# Patient Record
Sex: Female | Born: 1945 | Race: White | Hispanic: No | Marital: Married | Smoking: Never smoker
Health system: Southern US, Community
[De-identification: ages and names within clinical notes are randomized; demographics above are authoritative.]

## PROBLEM LIST (undated history)

## (undated) DIAGNOSIS — M542 Cervicalgia: Secondary | ICD-10-CM

## (undated) DIAGNOSIS — R51 Headache: Secondary | ICD-10-CM

## (undated) DIAGNOSIS — I272 Pulmonary hypertension, unspecified: Secondary | ICD-10-CM

## (undated) DIAGNOSIS — R519 Headache, unspecified: Secondary | ICD-10-CM

## (undated) DIAGNOSIS — F329 Major depressive disorder, single episode, unspecified: Secondary | ICD-10-CM

## (undated) DIAGNOSIS — D649 Anemia, unspecified: Secondary | ICD-10-CM

## (undated) DIAGNOSIS — I10 Essential (primary) hypertension: Secondary | ICD-10-CM

## (undated) DIAGNOSIS — M1990 Unspecified osteoarthritis, unspecified site: Secondary | ICD-10-CM

## (undated) DIAGNOSIS — F32A Depression, unspecified: Secondary | ICD-10-CM

## (undated) HISTORY — PX: APPENDECTOMY: SHX54

## (undated) HISTORY — PX: AMPUTATION FINGER: SHX6594

## (undated) HISTORY — PX: SPINAL CORD STIMULATOR IMPLANT: SHX2422

## (undated) HISTORY — PX: OOPHORECTOMY: SHX86

## (undated) HISTORY — PX: CARPAL TUNNEL RELEASE: SHX101

---

## 2004-10-01 ENCOUNTER — Ambulatory Visit: Payer: Self-pay | Admitting: Internal Medicine

## 2005-11-09 ENCOUNTER — Ambulatory Visit: Payer: Self-pay | Admitting: Internal Medicine

## 2006-04-22 ENCOUNTER — Ambulatory Visit: Payer: Self-pay | Admitting: Unknown Physician Specialty

## 2006-08-03 ENCOUNTER — Ambulatory Visit: Payer: Self-pay | Admitting: Anesthesiology

## 2006-08-16 ENCOUNTER — Ambulatory Visit: Payer: Self-pay | Admitting: Anesthesiology

## 2006-09-15 ENCOUNTER — Ambulatory Visit: Payer: Self-pay | Admitting: Anesthesiology

## 2006-10-17 ENCOUNTER — Ambulatory Visit: Payer: Self-pay | Admitting: Anesthesiology

## 2006-10-21 ENCOUNTER — Ambulatory Visit: Payer: Self-pay | Admitting: Unknown Physician Specialty

## 2006-12-15 ENCOUNTER — Ambulatory Visit: Payer: Self-pay | Admitting: Anesthesiology

## 2007-01-03 ENCOUNTER — Ambulatory Visit: Payer: Self-pay | Admitting: Anesthesiology

## 2007-02-02 ENCOUNTER — Ambulatory Visit: Payer: Self-pay | Admitting: Internal Medicine

## 2007-02-15 ENCOUNTER — Ambulatory Visit: Payer: Self-pay | Admitting: Anesthesiology

## 2008-02-13 ENCOUNTER — Ambulatory Visit: Payer: Self-pay | Admitting: Internal Medicine

## 2009-04-16 ENCOUNTER — Ambulatory Visit: Payer: Self-pay | Admitting: Internal Medicine

## 2010-01-05 ENCOUNTER — Ambulatory Visit: Payer: Self-pay | Admitting: Unknown Physician Specialty

## 2010-05-08 ENCOUNTER — Ambulatory Visit: Payer: Self-pay | Admitting: Internal Medicine

## 2011-06-11 ENCOUNTER — Emergency Department: Payer: Self-pay | Admitting: Emergency Medicine

## 2011-06-17 ENCOUNTER — Ambulatory Visit: Payer: Self-pay | Admitting: Internal Medicine

## 2011-06-30 ENCOUNTER — Ambulatory Visit: Payer: Self-pay | Admitting: Internal Medicine

## 2011-08-11 ENCOUNTER — Ambulatory Visit: Payer: Self-pay | Admitting: Cardiology

## 2011-11-01 ENCOUNTER — Ambulatory Visit: Payer: Self-pay | Admitting: Specialist

## 2012-05-03 ENCOUNTER — Ambulatory Visit: Payer: Self-pay | Admitting: Internal Medicine

## 2012-10-12 ENCOUNTER — Ambulatory Visit: Payer: Self-pay | Admitting: Internal Medicine

## 2012-12-28 ENCOUNTER — Ambulatory Visit: Payer: Self-pay | Admitting: Internal Medicine

## 2013-02-26 ENCOUNTER — Ambulatory Visit: Payer: Self-pay | Admitting: Specialist

## 2013-05-04 ENCOUNTER — Ambulatory Visit: Payer: Self-pay | Admitting: Internal Medicine

## 2013-06-17 ENCOUNTER — Inpatient Hospital Stay: Payer: Self-pay | Admitting: Internal Medicine

## 2013-06-17 LAB — COMPREHENSIVE METABOLIC PANEL
Albumin: 3.6 g/dL (ref 3.4–5.0)
Anion Gap: 7 (ref 7–16)
BUN: 10 mg/dL (ref 7–18)
Bilirubin,Total: 0.6 mg/dL (ref 0.2–1.0)
Calcium, Total: 8.7 mg/dL (ref 8.5–10.1)
Chloride: 107 mmol/L (ref 98–107)
Co2: 26 mmol/L (ref 21–32)
Creatinine: 0.85 mg/dL (ref 0.60–1.30)
EGFR (Non-African Amer.): >60
Glucose: 113 mg/dL — ABNORMAL HIGH (ref 65–99)
Potassium: 3.8 mmol/L (ref 3.5–5.1)
SGOT(AST): 21 U/L (ref 15–37)
SGPT (ALT): 22 U/L (ref 12–78)
Sodium: 140 mmol/L (ref 136–145)
Total Protein: 6.5 g/dL (ref 6.4–8.2)

## 2013-06-17 LAB — URINALYSIS, COMPLETE
Bacteria: NONE SEEN
Bilirubin,UR: NEGATIVE
Glucose,UR: NEGATIVE mg/dL (ref 0–75)
Ketone: NEGATIVE
Nitrite: NEGATIVE
Ph: 6 (ref 4.5–8.0)
Protein: 25
Squamous Epithelial: 1

## 2013-06-17 LAB — PROTIME-INR
INR: 1
Prothrombin Time: 12.9 secs (ref 11.5–14.7)

## 2013-06-17 LAB — CBC
HCT: 41 % (ref 35.0–47.0)
MCHC: 32.9 g/dL (ref 32.0–36.0)
MCV: 86 fL (ref 80–100)
Platelet: 280 10*3/uL (ref 150–440)
RBC: 4.79 10*6/uL (ref 3.80–5.20)
RDW: 14 % (ref 11.5–14.5)
WBC: 8.2 10*3/uL (ref 3.6–11.0)

## 2013-06-17 LAB — LIPASE, BLOOD: Lipase: 82 U/L (ref 73–393)

## 2013-06-18 LAB — CBC WITH DIFFERENTIAL/PLATELET
Basophil %: 0.8 %
Eosinophil #: 0.1 10*3/uL (ref 0.0–0.7)
HCT: 37.8 % (ref 35.0–47.0)
Lymphocyte #: 1.6 10*3/uL (ref 1.0–3.6)
MCH: 29.2 pg (ref 26.0–34.0)
MCHC: 34.2 g/dL (ref 32.0–36.0)
Monocyte #: 0.8 x10 3/mm (ref 0.2–0.9)
Monocyte %: 10.5 %
Neutrophil #: 5.3 10*3/uL (ref 1.4–6.5)
Neutrophil %: 67 %
Platelet: 230 10*3/uL (ref 150–440)
RBC: 4.42 10*6/uL (ref 3.80–5.20)
WBC: 7.9 10*3/uL (ref 3.6–11.0)

## 2013-06-18 LAB — BASIC METABOLIC PANEL
BUN: 7 mg/dL (ref 7–18)
Calcium, Total: 8.4 mg/dL — ABNORMAL LOW (ref 8.5–10.1)
Co2: 26 mmol/L (ref 21–32)
EGFR (African American): >60
Potassium: 3.4 mmol/L — ABNORMAL LOW (ref 3.5–5.1)

## 2013-06-18 LAB — TSH: Thyroid Stimulating Horm: 2.93 u[IU]/mL

## 2013-06-18 LAB — MAGNESIUM: Magnesium: 1.5 mg/dL — ABNORMAL LOW

## 2013-06-18 LAB — CLOSTRIDIUM DIFFICILE BY PCR

## 2013-06-19 LAB — CBC WITH DIFFERENTIAL/PLATELET
Basophil #: 0.1 10*3/uL (ref 0.0–0.1)
Basophil %: 1.2 %
Eosinophil #: 0.1 10*3/uL (ref 0.0–0.7)
Eosinophil %: 1.8 %
HGB: 13.5 g/dL (ref 12.0–16.0)
MCH: 29.1 pg (ref 26.0–34.0)
MCV: 85 fL (ref 80–100)
Monocyte #: 0.4 x10 3/mm (ref 0.2–0.9)
Monocyte %: 6.1 %
RBC: 4.65 10*6/uL (ref 3.80–5.20)
WBC: 6.5 10*3/uL (ref 3.6–11.0)

## 2013-06-19 LAB — POTASSIUM: Potassium: 3.9 mmol/L (ref 3.5–5.1)

## 2013-06-20 LAB — CBC WITH DIFFERENTIAL/PLATELET
Basophil #: 0.1 10*3/uL (ref 0.0–0.1)
Eosinophil %: 1.8 %
HCT: 38.4 % (ref 35.0–47.0)
HGB: 13.3 g/dL (ref 12.0–16.0)
Lymphocyte #: 1.1 10*3/uL (ref 1.0–3.6)
MCH: 29.4 pg (ref 26.0–34.0)
MCHC: 34.8 g/dL (ref 32.0–36.0)
MCV: 85 fL (ref 80–100)
Monocyte #: 0.5 x10 3/mm (ref 0.2–0.9)
Monocyte %: 7.5 %
Neutrophil #: 4.3 10*3/uL (ref 1.4–6.5)
Neutrophil %: 71.7 %
Platelet: 256 10*3/uL (ref 150–440)
RBC: 4.54 10*6/uL (ref 3.80–5.20)

## 2013-06-20 LAB — BASIC METABOLIC PANEL
Chloride: 109 mmol/L — ABNORMAL HIGH (ref 98–107)
Creatinine: 0.77 mg/dL (ref 0.60–1.30)
EGFR (African American): >60
EGFR (Non-African Amer.): >60
Osmolality: 276 (ref 275–301)
Potassium: 4 mmol/L (ref 3.5–5.1)

## 2013-06-20 LAB — URINE CULTURE

## 2013-06-21 LAB — LIPID PANEL: Cholesterol: 172 mg/dL (ref 0–200)

## 2013-06-21 LAB — HEMATOCRIT: HCT: 39.8 % (ref 35.0–47.0)

## 2013-07-03 ENCOUNTER — Ambulatory Visit: Payer: Self-pay | Admitting: Internal Medicine

## 2013-07-04 LAB — CANCER ANTIGEN 19-9: CA 19-9: <1 U/mL (ref 0–35)

## 2013-07-04 LAB — CEA: CEA: 1.3 ng/mL (ref 0.0–4.7)

## 2013-07-04 LAB — CANCER ANTIGEN 27.29: CA 27.29: 24.5 U/mL (ref 0.0–38.6)

## 2013-07-04 LAB — CA 125: CA 125: 12.7 U/mL (ref 0.0–34.0)

## 2013-07-06 ENCOUNTER — Ambulatory Visit: Payer: Self-pay | Admitting: Internal Medicine

## 2013-07-26 LAB — PROT IMMUNOELECTROPHORES(ARMC)

## 2013-08-06 ENCOUNTER — Ambulatory Visit: Payer: Self-pay | Admitting: Internal Medicine

## 2013-08-20 ENCOUNTER — Ambulatory Visit: Payer: Self-pay | Admitting: Unknown Physician Specialty

## 2013-08-22 LAB — PATHOLOGY REPORT

## 2014-08-08 ENCOUNTER — Ambulatory Visit: Payer: Self-pay | Admitting: Internal Medicine

## 2015-03-28 NOTE — Consult Note (Signed)
Brief Consult Note: Diagnosis: ISCHEMIC COLITIS.   Comments: SEE DICTATED NOTE TO FOLLOW   NO ACUTE COMPLAINTS STOPPED BLEEDING ABDO NON TENDER LINGS CLEAR NODES NEG....IMP NO HX CLOT, NO FH CLOT, UP TO DATE WITH MAMMOGRAM. YIELD FOR INHERITED CLOTTING DISORDER IS LOW. AGREE WOULD CHECK FOR ACQUIRED AND GENETIC ABNORMALITIES IF  CT ANGIOGRAM DOES NOT SHOW A STRUCTURAL PROBLEM.  WOULD START WITH LUPUS AND CARDIOLIPIN ABS, PROT C AND S AND V LIDEN AND PROTHROMBIN MUTATION.  WOULD LATER CHECK TUMOR MARKERS. I WILL FOLLOW . IF DISCHARGE TOMORROW OK TO F/U FOR WORK UP IN CANCER CENTER AS OUT PATIENT.  Electronic Signatures: Marin RobertsGittin, Robert G (MD)  (Signed 16-Jul-14 21:21)  Authored: Brief Consult Note   Last Updated: 16-Jul-14 21:21 by Marin RobertsGittin, Robert G (MD)

## 2015-03-28 NOTE — H&P (Signed)
PATIENT NAME:  Susan Mann, Susan Mann MR#:  [id number] DATE OF BIRTH:  07-28-1946  DATE OF ADMISSION:  06/17/2013  PRIMARY CARE PHYSICIAN: Dr. Daniel Nones.   PRIMARY GASTROENTEROLOGIST: Dr. Mechele Collin.   HISTORY OF PRESENT ILLNESS: Susan Mann is a 69 year old, pleasant, white female with history of sympathetic reflex dystrophy, neuropathy, who had a colonoscopy in 2007 by Dr. Mechele Collin and was found to have external and internal hemorrhoids. The patient was doing well in her usual state of health under yesterday. Started to have 1 episode of vomiting which contained the food what she ate at [employer]. Followed by had urge to go to the toilet. Did not switch on the light. Did not see if there was any blood in the stools. However, overnight the patient had about 20 episodes of stools. They were small stools. In the morning noted to have bright red blood per rectum. Continued to have multiple episodes. The patient states had about 30 episodes of bloody bowel movements. Denies having any abdominal pain. Prior to having these episodes, had 1 good bowel movement. The patient denied any straining. Last colonoscopy did not mention about the patient having any diverticulosis, Workup in the Emergency Department, the patient's hemoglobin is 13.5 which is no change in the last 2 years. Started to experience mild dizziness.   PAST MEDICAL HISTORY:  1. Reflex sympathetic dystrophy.  2. Neuropathy.   ALLERGIES: TEGRETOL.   HOME MEDICATIONS:  1. Norco 5/325 mg 1 tablet every 6 hours as needed.  2. Gabapentin 300 mg 3 times a day.  3. Clonazepam 1 mg tablet at bedtime.  4. Citalopram 40 mg once a day.   SOCIAL HISTORY: Denies smoking, drinking alcohol or using illicit drugs. Lives by herself.   PAST SURGICAL HISTORY:  1. Hysterectomy.  2. Appendectomy.  3. Tonsillectomy.  4. Shoulder surgery.  5. Leg surgery.   FAMILY HISTORY: Noncontributory.    REVIEW OF SYSTEMS:  CONSTITUTIONAL: Generalized weakness.  EYES: No  change in vision.  ENT: No change in hearing. No sore throat.  ENDOCRINE: No polyuria or polydipsia.  GENITOURINARY: No dysuria or hematuria.  SKIN: No rash or lesions.  MUSCULOSKELETAL: Has multiple joint pains and aches.  NEUROLOGIC: No weakness or numbness in any part of the body.   PHYSICAL EXAMINATION:  GENERAL: This is a well-built, well-nourished, age-appropriate female lying down in the bed, not in distress.  VITAL SIGNS: Temperature 97.2, pulse 65, blood pressure 189/91, respiratory rate of 16, oxygen saturation is 97% on room air.  HEENT: Head normocephalic and atraumatic. There is no scleral icterus. Conjunctivae normal. Pupils equal and react to light. Mucous membranes moist. No pharyngeal erythema.  NECK: Supple. No lymphadenopathy. No JVD. No carotid bruit.  CHEST: Has no focal tenderness.  LUNGS: Bilaterally clear to auscultation.  HEART: S1 and S2 regular. No murmurs are heard. No pedal edema. Pulses 2+.  ABDOMEN: Bowel sounds present. Soft, nontender, nondistended. No hepatosplenomegaly.  SKIN: No rash or lesions.  MUSCULOSKELETAL: Good range of motion in all of the extremities.   LABS: CMP is completely within normal limits.   CBC:  8.2, hemoglobin 13.5, platelet count of 280. Coag profile is well within normal limits.   UA 1+ leukocyte esterase.   ASSESSMENT AND PLAN: Susan Mann is a 69 year old female who comes to the Emergency Department with bright red blood per rectum.  1. Bright red blood per rectum: Seems to be from the external and internal hemorrhoids which were identified during the previous colonoscopy in 2007. There was  no mention about diverticulosis. The patient's hemoglobin is stable. Will keep the patient on Anusol suppository. Consult Dr. Mechele CollinElliott in the morning. Continue with intravenous fluids. Continue to monitor H and H.  2. Hypertension, poorly controlled: The patient could be having underlying hypertension. Currently not on any medications. Will  continue to follow up during the hospital stay. The patient would benefit from home monitoring of the blood pressure.  3. Reflex sympathetic dystrophy: Continue home medications.  4. Keep the patient on deep vein thrombosis prophylaxis with sequential compression devices.    TIME SPENT: 45 minutes.   ____________________________ Susa GriffinsPadmaja Johnna Bollier, MD pv:gb D: 06/17/2013 23:19:25 ET T: 06/18/2013 00:06:04 ET JOB#: [id number]  cc: Susa GriffinsPadmaja Rigel Filsinger, MD, <Dictator> Lynnea FerrierBert J. Klein III, MD Susa GriffinsPADMAJA Kingsley Herandez MD ELECTRONICALLY SIGNED 07/04/2013 0:25

## 2015-03-28 NOTE — Consult Note (Signed)
CC abd pain, rectal bleeding.  Bleeding has stopped, no vomiting, VSS, abd minimal tenderness.  WBC 6.5, hgb 13.5.  I talked with Dr. [place] in radiology and could not construct a satisfactory angiogram from her CT due to amt of contrast in study.  I plan to do flex sig tomorrow with a Fleets enema.    Electronic Signatures: Scot JunElliott, Robert T (MD)  (Signed on 15-Jul-14 17:05)  Authored  Last Updated: 15-Jul-14 17:05 by Scot JunElliott, Robert T (MD)

## 2015-03-28 NOTE — Consult Note (Signed)
Brief Consult Note: Diagnosis: GI bleed, BRBPR, lower abdominal pain, Rectosigmoid colitis.   Patient was seen by consultant.   Consult note dictated.   Recommend to proceed with surgery or procedure.   Orders entered.   Discussed with Attending MD.   Comments: Patient seen and evaluated. Rectosigmoid colitis noted on CT scan with persistent lower abdominal pain and BRBPR. Likely ischemic colitis vs infectious colitis. She has been started on Flagyl, we will add Cipro 500 BID. Continue fluids, electrolyte replacement. If no response to above, she will likely benefit from a Colonoscopy to evaluate, which can be done by Dr Mechele CollinElliott on weds if clinically warrented. This was discussed with the pt. Full consult dictated.  Electronic Signatures: Ashok Cordiaarle, Emilia Kayes M (PA-C)  (Signed 14-Jul-14 14:45)  Authored: Brief Consult Note   Last Updated: 14-Jul-14 14:45 by Ashok CordiaEarle, Jermari Tamargo M (PA-C)

## 2015-03-28 NOTE — Discharge Summary (Signed)
PATIENT NAME:  Susan Mann, Susan Mann MR#:  [id number] DATE OF BIRTH:  1946/03/21  DATE OF ADMISSION:  06/17/2013 DATE OF DISCHARGE:  06/21/2013  FINAL DIAGNOSES: 1.  Ischemic colitis.  2.  Reflex sympathetic dystrophy.  3.  Peripheral neuropathy.   HISTORY AND PHYSICAL:  Please see dictated admission history and physical summary.   HOSPITAL COURSE:  The patient was admitted with onset of left lower quadrant abdominal pain with lower GI bleeding, multiple bloody stools.  Her blood counts were followed, which fortunately did not drop enough for the patient to require a transfusion.  She was placed on antibiotic therapy, and imaging was obtained, and appeared to be most consistent with ischemic colitis.  GI was consulted and the patient underwent flexible sigmoidoscopy, which revealed similar findings.  She was evaluated by hematology, with hypercoagulable work-up initiated.  Fortunately with the above treatment, she showed good improvement, diet was advanced, she was taken off IV fluids, and had no further bleeding.  She was therefore discharged to home in stable condition with her physical activity to be up as tolerated.  She should follow a 2 gram sodium diet, though should keep her diet bland for approximately one week.  We anticipate her following up with Dr. Lorre NickGittin in 1 to 2 weeks, in my office in approximately 2 weeks, and with Dr. Mechele CollinElliott in 2 to 4 weeks.   DISCHARGE MEDICATIONS: 1.  Citalopram 40 mg by mouth daily.  2.  Clonazepam 1 mg by mouth at bedtime.  3.  Norco 5/325 mg, 1 by mouth q. 6 hours as needed for severe pain.  4.  Gabapentin 300 mg, 3 capsules 3 times a day.  5.  Flagyl 500 mg by mouth 3 times daily x 3 days.  6.  Zofran 4 mg by mouth q. 8 hours as needed nausea or vomiting.  7.  Aspirin 81 mg by mouth daily.  8.  Amlodipine 5 mg by mouth daily for new diagnosed hypertension.     ____________________________ Lynnea FerrierBert J. Klein III, MD bjk:ea D: 06/28/2013 22:18:55 ET T: 06/28/2013  23:38:54 ET JOB#: [id number]  cc: Lynnea FerrierBert J. Klein III, MD, <Dictator> Daniel NonesBERT KLEIN MD ELECTRONICALLY SIGNED 07/03/2013 7:32

## 2015-03-28 NOTE — Consult Note (Signed)
Pt CC rectal bleeding and abd pain.  She has a story very suggestive of ischemic colitis with CT findings in the appropriate area.  She is feeling better and the bleeding has stopped.  Will ask radiology to do a CT angiogram with the CT that was already done and I plan to look with a light Wednesday for a flex sig.  Will recommend hematology work up for hypercoagulopathy.  Discussed with patient and her daughter who used to work with me at [facility] as an Charity fundraiserN.  Electronic Signatures: Scot JunElliott, Mekenzie Modeste T (MD)  (Signed on 14-Jul-14 19:18)  Authored  Last Updated: 14-Jul-14 19:18 by Scot JunElliott, Florance Paolillo T (MD)

## 2015-03-28 NOTE — Consult Note (Signed)
PATIENT NAME:  Susan Mann, Susan Mann MR#:  [id number] DATE OF BIRTH:  Dec 16, 1945  DATE OF CONSULTATION:  06/18/2013  REFERRING PHYSICIAN:  Susa GriffinsPadmaja Vasireddy, MD CONSULTING PHYSICIAN:  Hardie ShackletonKaryn M. Shanikka Wonders, PA-C  REASON FOR CONSULTATION:  Gastrointestinal bleed and abdominal pain.   HISTORY OF PRESENT ILLNESS: This is a pleasant 69 year old female who was admitted to the hospital yesterday for acute onset GI bleeding. She states that she ate out at a [employer], had 1 episode of vomiting, and since that time has had multiple episodes of bright red blood per rectum. She states that she will have 20 to 30 episodes in the past 24 hours where she gets up to use the restroom feeling as though she needs to have a bowel movement, however, just blood comes out. Her last well-formed bowel movement was at the initial onset of these symptoms. There are no other sick contacts and her husband did eat out at [employer] with her and ate the same thing, but he is feeling fine.  There is associated lower abdominal pain that is constant. A CT scan of the abdomen and pelvis was obtained showing bowel wall thickening at the rectosigmoid region concerning for colitis. She was started empirically on Flagyl therapy. Of note, her hemoglobin was 14.1 on admission and it has declined down to 12.9. Her vital signs have been stable. There is no longer any nausea or vomiting. There has been no melena. No fever or chills. No chest pain or shortness of breath. She has previously seen Dr. Mechele CollinElliott through [facility] and per the patient her last colonoscopy was in 2011 with a history of colon polyps. Also C. diff was negative.   PAST MEDICAL HISTORY: Neuropathy and sympathetic reflex dystrophy.   ALLERGIES: TEGRETOL.   HOME MEDICATIONS: Clonazepam, gabapentin, Norco, and citalopram.   PAST SURGICAL HISTORY: Hysterectomy, appendectomy, tonsillectomy, shoulder surgery and leg surgery.   FAMILY HISTORY: There is no known family history of GI  malignancy, colon polyps or IBD.   SOCIAL HISTORY: The patient denies any alcohol, tobacco or illicit drug use.   REVIEW OF SYSTEMS: A 10 system review of systems was obtained on the patient. She has noticed some increased weakness. All other pertinent positives are mentioned above and otherwise negative.   PHYSICAL EXAMINATION: VITAL SIGNS: Blood pressure 169/69, pulse 60, respirations 18, temp 97.6, bedside pulse ox 97%.  GENERAL: This is a pleasant 69 year old female resting quietly and comfortably in the exam room, in no acute distress. Alert and oriented x 3.  HEAD: Atraumatic, normocephalic.  NECK: Supple. No lymphadenopathy noted.  HENT: Sclerae anicteric. Mucous membranes moist.  LUNGS: Respirations are even and unlabored, clear to auscultation bilateral in anterior lung fields.  HEART: Regular rate and rhythm. S1, S2 noted.  ABDOMEN: Soft. Tender to palpation in the left lower quadrant and slightly in the right lower quadrant. No guarding or rebound. No signs of an acute abdomen. No organomegaly, masses or hernia is appreciated. Normoactive bowel sounds noted in all 4 quadrants.  RECTAL: Deferred.  PSYCHIATRIC: Appropriate mood and affect.   ASSESSMENT: 1.  Bright red blood per rectum.  2.  Lower abdominal pain.  3.  Abnormal CT scan noting rectosigmoid colitis.   PLAN: I have discussed this patient's case in detail with Dr. Lynnae Prudeobert Elliott who is involved in the development of the patient's plan of care. At this time, we do agree with the patient being maintained on empiric antibiotics including Flagyl, and we would like to add Cipro; however, there is  a contraindication with 1 of her other medications. Therefore, we will discuss with the pharmacist regarding other recommendations. Based on the CT findings and her history of persistent blood per rectum without accompanying stool, this may likely be an ischemic colitis. Therefore, we would like to proceed with a colonoscopy, likely to  be performed on Wednesday. We will see how she responds to the above antibiotics because certainly another differential possibility would be an infectious cause. The above plan was discussed in detail with the patient and the patient's family members who verbalize understanding and are in agreement. We will continue to monitor this patient closely and make further recommendations pending above and per clinical course.   Thank you so much for this consultation and for allowing [place] to participate in the patient's plan of care.  This services provided by Hardie Shackleton. Monia Timmers, PA-C under collaborative agreement with Dr. Lynnae Prude. ____________________________ Hardie Shackleton. Daniesha Driver, PA-C kme:sb D: 06/18/2013 15:17:58 ET T: 06/18/2013 15:39:54 ET JOB#: [id number]  cc: Hardie Shackleton. Caeleb Batalla, PA-C, <Dictator> Hardie Shackleton Shakenna Herrero PA ELECTRONICALLY SIGNED 06/20/2013 14:14

## 2015-03-28 NOTE — Consult Note (Signed)
CC: rectal bleeding. Pt without bleeding.  Flex sig shows changes suggestive of ischemic colitis with ulcerations and sharp demarcation of normal and inflammed.  Will get CT angiogram tomorrow and hematology consult today.  Electronic Signatures: Scot JunElliott, Robert T (MD)  (Signed on 16-Jul-14 13:53)  Authored  Last Updated: 16-Jul-14 13:53 by Scot JunElliott, Robert T (MD)

## 2015-06-24 ENCOUNTER — Other Ambulatory Visit: Payer: Self-pay | Admitting: Specialist

## 2015-06-24 DIAGNOSIS — M545 Low back pain: Secondary | ICD-10-CM

## 2015-06-26 ENCOUNTER — Ambulatory Visit
Admission: RE | Admit: 2015-06-26 | Discharge: 2015-06-26 | Disposition: A | Discharge status: Home / Self Care | Payer: Medicare Other | Source: Ambulatory Visit | Attending: Specialist | Admitting: Specialist

## 2015-06-26 DIAGNOSIS — M545 Low back pain: Secondary | ICD-10-CM

## 2015-07-10 ENCOUNTER — Telehealth (HOSPITAL_COMMUNITY): Payer: Self-pay | Admitting: Interventional Radiology

## 2015-07-10 ENCOUNTER — Other Ambulatory Visit (HOSPITAL_COMMUNITY): Payer: Self-pay | Admitting: Interventional Radiology

## 2015-07-10 DIAGNOSIS — M549 Dorsalgia, unspecified: Secondary | ICD-10-CM

## 2015-07-10 DIAGNOSIS — IMO0002 Reserved for concepts with insufficient information to code with codable children: Secondary | ICD-10-CM

## 2015-07-10 NOTE — Telephone Encounter (Signed)
Called pt's daughter, left VM for her to call to schedule appt for her mother JM °

## 2015-07-11 ENCOUNTER — Telehealth (HOSPITAL_COMMUNITY): Payer: Self-pay | Admitting: Interventional Radiology

## 2015-07-16 ENCOUNTER — Other Ambulatory Visit: Payer: Self-pay | Admitting: Radiology

## 2015-07-17 ENCOUNTER — Encounter (HOSPITAL_COMMUNITY): Payer: Self-pay

## 2015-07-17 ENCOUNTER — Other Ambulatory Visit (HOSPITAL_COMMUNITY): Payer: Self-pay | Admitting: Interventional Radiology

## 2015-07-17 ENCOUNTER — Ambulatory Visit (HOSPITAL_COMMUNITY)
Admission: RE | Admit: 2015-07-17 | Discharge: 2015-07-17 | Disposition: A | Discharge status: Home / Self Care | Payer: Medicare Other | Source: Ambulatory Visit | Attending: Interventional Radiology | Admitting: Interventional Radiology

## 2015-07-17 DIAGNOSIS — IMO0002 Reserved for concepts with insufficient information to code with codable children: Secondary | ICD-10-CM | POA: Insufficient documentation

## 2015-07-17 DIAGNOSIS — K559 Vascular disorder of intestine, unspecified: Secondary | ICD-10-CM | POA: Diagnosis not present

## 2015-07-17 DIAGNOSIS — M4856XA Collapsed vertebra, not elsewhere classified, lumbar region, initial encounter for fracture: Secondary | ICD-10-CM | POA: Insufficient documentation

## 2015-07-17 DIAGNOSIS — M8588 Other specified disorders of bone density and structure, other site: Secondary | ICD-10-CM | POA: Diagnosis not present

## 2015-07-17 DIAGNOSIS — G9050 Complex regional pain syndrome I, unspecified: Secondary | ICD-10-CM | POA: Diagnosis not present

## 2015-07-17 DIAGNOSIS — M549 Dorsalgia, unspecified: Secondary | ICD-10-CM

## 2015-07-17 DIAGNOSIS — G629 Polyneuropathy, unspecified: Secondary | ICD-10-CM | POA: Diagnosis not present

## 2015-07-17 LAB — CBC
HCT: 40.7 % (ref 36.0–46.0)
Hemoglobin: 13.7 g/dL (ref 12.0–15.0)
MCH: 28.7 pg (ref 26.0–34.0)
MCHC: 33.7 g/dL (ref 30.0–36.0)
MCV: 85.1 fL (ref 78.0–100.0)
PLATELETS: 272 10*3/uL (ref 150–400)
RBC: 4.78 MIL/uL (ref 3.87–5.11)
RDW: 14.1 % (ref 11.5–15.5)
WBC: 4.8 10*3/uL (ref 4.0–10.5)

## 2015-07-17 LAB — BASIC METABOLIC PANEL
Anion gap: 8 (ref 5–15)
BUN: 13 mg/dL (ref 6–20)
CALCIUM: 9.2 mg/dL (ref 8.9–10.3)
CO2: 26 mmol/L (ref 22–32)
Chloride: 106 mmol/L (ref 101–111)
Creatinine, Ser: 0.74 mg/dL (ref 0.44–1.00)
GFR calc Af Amer: >60 mL/min (ref >60)
GFR calc non Af Amer: >60 mL/min (ref >60)
GLUCOSE: 83 mg/dL (ref 65–99)
Potassium: 4 mmol/L (ref 3.5–5.1)
SODIUM: 140 mmol/L (ref 135–145)

## 2015-07-17 LAB — APTT: APTT: 28 s (ref 24–37)

## 2015-07-17 LAB — PROTIME-INR
INR: 0.95 (ref 0.00–1.49)
PROTHROMBIN TIME: 12.9 s (ref 11.6–15.2)

## 2015-07-17 MED ORDER — FENTANYL CITRATE (PF) 100 MCG/2ML IJ SOLN
INTRAMUSCULAR | Status: AC
  Filled 2015-07-17: qty 4

## 2015-07-17 MED ORDER — MIDAZOLAM HCL 2 MG/2ML IJ SOLN
INTRAMUSCULAR | Status: AC
  Filled 2015-07-17: qty 4

## 2015-07-17 MED ORDER — BUPIVACAINE HCL (PF) 0.25 % IJ SOLN
INTRAMUSCULAR | Status: AC
  Filled 2015-07-17: qty 30

## 2015-07-17 MED ORDER — HYDROMORPHONE HCL 1 MG/ML IJ SOLN
INTRAMUSCULAR | Status: AC | PRN
  Administered 2015-07-17: 1 mg via INTRAVENOUS

## 2015-07-17 MED ORDER — SODIUM CHLORIDE 0.9 % IV SOLN: INTRAVENOUS | Status: AC

## 2015-07-17 MED ORDER — HYDROMORPHONE HCL 1 MG/ML IJ SOLN
INTRAMUSCULAR | Status: DC
Start: 2015-07-17 — End: 2015-07-18
  Filled 2015-07-17: qty 1

## 2015-07-17 MED ORDER — CEFAZOLIN SODIUM-DEXTROSE 2-3 GM-% IV SOLR
2.0000 g | INTRAVENOUS | Status: AC
  Administered 2015-07-17: 2 g via INTRAVENOUS

## 2015-07-17 MED ORDER — TOBRAMYCIN SULFATE 1.2 G IJ SOLR
INTRAMUSCULAR | Status: AC
  Filled 2015-07-17: qty 1.2

## 2015-07-17 MED ORDER — CEFAZOLIN SODIUM-DEXTROSE 2-3 GM-% IV SOLR
INTRAVENOUS | Status: AC
  Filled 2015-07-17: qty 50

## 2015-07-17 MED ORDER — FENTANYL CITRATE (PF) 100 MCG/2ML IJ SOLN
INTRAMUSCULAR | Status: AC | PRN
  Administered 2015-07-17 (×4): 25 ug via INTRAVENOUS

## 2015-07-17 MED ORDER — MIDAZOLAM HCL 2 MG/2ML IJ SOLN
INTRAMUSCULAR | Status: AC | PRN
  Administered 2015-07-17 (×3): 1 mg via INTRAVENOUS

## 2015-07-17 MED ORDER — SODIUM CHLORIDE 0.9 % IV SOLN: Freq: Once | INTRAVENOUS | Status: DC

## 2015-07-17 NOTE — H&P (Signed)
Chief Complaint: Patient was seen in consultation today for compression fractures at the request of Dr. Jene Every  Referring Physician(s): Dr. Jene Every  History of Present Illness: Susan Mann is a 69 y.o. female with history of compression fractures approximately 2 years ago treated with a brace. She states end of may she developed lower back pain with no known injury and this pain is 10/10 without pain medication and with hydrocodone that she takes every 4-6 hrs 7/10. She denies any new loss of bladder or bowel function. She does admit to radiation of pain down the side and anterior portion of her right leg. She has had a CT scan on 06/26/15 and has been referred for possible VP/KP. She denies any chest pain, shortness of breath or palpitations. She denies any active signs of bleeding or excessive bruising. She denies any recent fever or chills. The patient denies any history of sleep apnea or chronic oxygen use. She has previously tolerated sedation without complications.    PMHx: Previous Compression fracture, ischemic colitis, reflex sympathetic dystrophy, peripheral neuropathy.   History reviewed. No pertinent past surgical history.  Allergies: Tegretol  Medications: Prior to Admission medications   Medication Sig Start Date End Date Taking? Authorizing Provider  acetaminophen (TYLENOL) 500 MG tablet Take 1,000 mg by mouth every 8 (eight) hours as needed for headache.   Yes Historical Provider, MD  amLODipine (NORVASC) 5 MG tablet Take 5 mg by mouth every morning.   Yes Historical Provider, MD  Calcium-Magnesium-Vitamin D (CALCIUM 1200+D3 PO) Take 2 tablets by mouth every morning.   Yes Historical Provider, MD  citalopram (CELEXA) 40 MG tablet Take 40 mg by mouth every morning.   Yes Historical Provider, MD  clonazePAM (KLONOPIN) 1 MG tablet Take 1 mg by mouth at bedtime as needed for anxiety.   Yes Historical Provider, MD  gabapentin (NEURONTIN) 300 MG capsule Take  900 mg by mouth 3 (three) times daily.   Yes Historical Provider, MD  HYDROcodone-acetaminophen (NORCO/VICODIN) 5-325 MG per tablet Take 1 tablet by mouth every 6 (six) hours as needed for moderate pain.   Yes Historical Provider, MD  naproxen sodium (ALEVE) 220 MG tablet Take 220 mg by mouth 2 (two) times daily with a meal.   Yes Historical Provider, MD  PSYLLIUM PO Take 5 mLs by mouth daily. Mix with water   Yes Historical Provider, MD     History reviewed. No pertinent family history.  Social History   Social History  . Marital Status: Married    Spouse Name: N/A  . Number of Children: N/A  . Years of Education: N/A   Social History Main Topics  . Smoking status: None  . Smokeless tobacco: None  . Alcohol Use: None  . Drug Use: None  . Sexual Activity: Not Asked   Other Topics Concern  . None   Social History Narrative  . None   Review of Systems: A 12 point ROS discussed and pertinent positives are indicated in the HPI above.  All other systems are negative.  Review of Systems  Vital Signs: BP 141/72 mmHg  Pulse 57  Temp(Src) 98 F (36.7 C)  Resp 20  Ht 5\' 3"  (1.6 m)  Wt 170 lb (77.111 kg)  BMI 30.12 kg/m2  SpO2 99%  Physical Exam  Constitutional: She is oriented to person, place, and time. No distress.  HENT:  Head: Normocephalic and atraumatic.  Cardiovascular: Normal rate and regular rhythm.  Exam reveals no  gallop and no friction rub.   No murmur heard. Pulmonary/Chest: Effort normal and breath sounds normal. No respiratory distress. She has no wheezes. She has no rales.  Abdominal: Soft. Bowel sounds are normal. She exhibits no distension. There is no tenderness.  Musculoskeletal:  L2-L4 region TTP, no skin changes  Neurological: She is alert and oriented to person, place, and time.  Skin: Skin is warm and dry. She is not diaphoretic.    Mallampati Score:  MD Evaluation Airway: WNL Heart: WNL Abdomen: WNL Chest/ Lungs: WNL ASA  Classification:  2 Mallampati/Airway Score: Two  Imaging: Ct Lumbar Spine Wo Contrast  06/26/2015   CLINICAL DATA:  Low back pain without radiation for 4 weeks. History of L2 fracture. Initial encounter.  EXAM: CT LUMBAR SPINE WITHOUT CONTRAST  TECHNIQUE: Multidetector CT imaging of the lumbar spine was performed without intravenous contrast administration. Multiplanar CT image reconstructions were also generated.  COMPARISON:  CT lumbar spine 02/26/2013.  FINDINGS: Remote, mild superior endplate compression fracture of L2 is identified and was seen on the prior examination. The patient has an inferior endplate compression fracture of L4 with vertebral body height loss up to approximately 50% centrally. Although fracture cannot be definitively characterized, fracture lines appear sharp suggesting acute or subacute injury. No extension into the posterior elements is identified. Vertebral body height is otherwise maintained. Alignment is unremarkable. Imaged intra-abdominal contents demonstrate a punctate nonobstructing stone in the lower pole left kidney. Scattered aortic atherosclerosis is noted.  T11-12: Mild ligamentum flavum thickening and facet degenerative change. The central canal and foramina appear widely patent.  T12-L1: There is some facet degenerative change. No disc bulge or protrusion. The central canal and foramina appear widely patent.  L1-2: There is a is very shallow disc bulge and endplate spur. The central canal and foramina appear widely patent.  L2-3: There is a shallow disc bulge causing mild central canal narrowing. The foramina appear open.  L3-4: There is some facet degenerative change and ligamentum flavum thickening. Minimal disc bulge is identified. There is only mild central canal narrowing. The foramina appear open.  L4-5: Advanced bilateral facet degenerative change. Shallow disc bulge is identified there is mild bony retropulsion off the inferior endplate of L4. The central canal appears mildly  narrowed. The foramina appear open.  L5-S1: Facet degenerative change is noted. The disc is partially calcified. There is a minimal bulge but the central canal and foramina appear open.  IMPRESSION: Inferior endplate compression fracture of L4 cannot be definitively characterized but appears acute or subacute.  Remote L2 superior endplate compression fracture is present on the comparison examination.  Overall mild spondylosis as described above is most notable at L3-4 and L4-5.  Punctate nonobstructing stone lower pole left kidney.  Atherosclerosis.   Electronically Signed   By: Drusilla Kanner M.D.   On: 06/26/2015 10:52    Labs:  CBC:  Recent Labs  07/17/15 1000  WBC 4.8  HGB 13.7  HCT 40.7  PLT 272    COAGS: No results for input(s): INR, APTT in the last 8760 hours.  BMP:  Recent Labs  07/17/15 1000  NA 140  K 4.0  CL 106  CO2 26  GLUCOSE 83  BUN 13  CALCIUM 9.2  CREATININE 0.74  GFRNONAA >60  GFRAA >60    Assessment and Plan: Lower back pain since 04/2015, no known injury- uncontrolled with pain medication History of compression fracture 2 years ago treated with brace CT 06/26/15 revealed L2 and L4 compression  fracture, Tenderness on exam Scheduled today for image guided L2 and L4 Vertebroplasty/Kyphoplasty with sedation  The patient has been NPO, no blood thinners taken, labs and vitals have been reviewed. Risks and Benefits discussed with the patient including, but not limited to education regarding the natural healing process of compression fractures without intervention, bleeding, infection, cement migration which may cause spinal cord damage, paralysis or pulmonary embolism. All of the patient's questions were answered, patient is agreeable to proceed. Consent signed and in chart.    Thank you for this interesting consult.  I greatly enjoyed meeting SHERYLANN VANGORDEN and look forward to participating in their care.  A copy of this report was sent to the requesting  provider on this date.  SignedBerneta Levins 07/17/2015, 10:56 AM   I spent a total of 30 Minutes in face to face in clinical consultation, greater than 50% of which was counseling/coordinating care for uncontrolled back pain with associated compression fractures.

## 2015-07-17 NOTE — Discharge Instructions (Signed)
1.No stooping,bending or lifting more than 10 lbs for 2 weeks. 2.Use Axtell to ambulate for 2 weeks. 3.RTC in 2 weeks  KYPHOPLASTY/VERTEBROPLASTY DISCHARGE INSTRUCTIONS  Medications: (check all that apply)     Resume all home medications as before procedure.       Resume your (aspirin/Plavix/Coumadin) on 07/18/15.                  Continue your pain medications as prescribed as needed.  Over the next 3-5 days, decrease your pain medication as tolerated.  Over the counter medications (i.e. Tylenol, ibuprofen, and aleve) may be substituted once severe/moderate pain symptoms have subsided.   Wound Care: - Bandages may be removed the day following your procedure.  You may get your incision wet once bandages are removed.  Bandaids may be used to cover the incisions until scab formation.  Topical ointments are optional.  - If you develop a fever greater than 101 degrees, have increased skin redness at the incision sites or pus-like oozing from incisions occurring within 1 week of the procedure, contact radiology at [phone] or [phone].  - Ice pack to back for 15-20 minutes 2-3 time per day for first 2-3 days post procedure.  The ice will expedite muscle healing and help with the pain from the incisions.   Activity: - Bedrest today with limited activity for 24 hours post procedure.  - No driving for 48 hours.  - Increase your activity as tolerated after bedrest (with assistance if necessary).  - Refrain from any strenuous activity or heavy lifting (greater than 10 lbs.).   Follow up: - Contact radiology at [phone] or [phone] if any questions/concerns.  - A physician assistant from radiology will contact you in approximately 1 week.  - If a biopsy was performed at the time of your procedure, your referring physician should receive the results in usually 2-3 days.

## 2015-07-17 NOTE — Procedures (Signed)
S/P L2  And L 4 balloon KPs

## 2015-07-29 ENCOUNTER — Other Ambulatory Visit (HOSPITAL_COMMUNITY): Payer: Self-pay | Admitting: Interventional Radiology

## 2015-07-29 DIAGNOSIS — IMO0002 Reserved for concepts with insufficient information to code with codable children: Secondary | ICD-10-CM

## 2015-08-13 ENCOUNTER — Ambulatory Visit (HOSPITAL_COMMUNITY): Admission: RE | Admit: 2015-08-13 | Payer: Medicare Other | Source: Ambulatory Visit

## 2015-08-25 ENCOUNTER — Ambulatory Visit (HOSPITAL_COMMUNITY)
Admission: RE | Admit: 2015-08-25 | Discharge: 2015-08-25 | Disposition: A | Discharge status: Home / Self Care | Payer: Medicare Other | Source: Ambulatory Visit | Attending: Interventional Radiology | Admitting: Interventional Radiology

## 2015-08-25 DIAGNOSIS — IMO0002 Reserved for concepts with insufficient information to code with codable children: Secondary | ICD-10-CM

## 2015-08-28 ENCOUNTER — Other Ambulatory Visit (HOSPITAL_COMMUNITY): Payer: Self-pay | Admitting: Interventional Radiology

## 2015-08-28 DIAGNOSIS — M549 Dorsalgia, unspecified: Secondary | ICD-10-CM

## 2015-08-28 DIAGNOSIS — IMO0002 Reserved for concepts with insufficient information to code with codable children: Secondary | ICD-10-CM

## 2015-09-01 ENCOUNTER — Ambulatory Visit
Admission: RE | Admit: 2015-09-01 | Discharge: 2015-09-01 | Disposition: A | Discharge status: Home / Self Care | Payer: Medicare Other | Source: Ambulatory Visit | Attending: Interventional Radiology | Admitting: Interventional Radiology

## 2015-09-01 DIAGNOSIS — M4856XD Collapsed vertebra, not elsewhere classified, lumbar region, subsequent encounter for fracture with routine healing: Secondary | ICD-10-CM | POA: Diagnosis not present

## 2015-09-01 DIAGNOSIS — IMO0002 Reserved for concepts with insufficient information to code with codable children: Secondary | ICD-10-CM

## 2015-09-01 DIAGNOSIS — M549 Dorsalgia, unspecified: Secondary | ICD-10-CM | POA: Insufficient documentation

## 2015-09-01 DIAGNOSIS — T148 Other injury of unspecified body region: Secondary | ICD-10-CM | POA: Diagnosis present

## 2015-09-01 DIAGNOSIS — X58XXXS Exposure to other specified factors, sequela: Secondary | ICD-10-CM | POA: Insufficient documentation

## 2015-09-01 MED ORDER — TECHNETIUM TC 99M MEDRONATE IV KIT
25.0000 | PACK | Freq: Once | INTRAVENOUS | Status: AC | PRN
  Administered 2015-09-01: 23.36 via INTRAVENOUS

## 2015-09-02 ENCOUNTER — Other Ambulatory Visit: Payer: Self-pay | Admitting: Internal Medicine

## 2015-09-02 DIAGNOSIS — Z1231 Encounter for screening mammogram for malignant neoplasm of breast: Secondary | ICD-10-CM

## 2015-09-09 ENCOUNTER — Other Ambulatory Visit: Payer: Self-pay | Admitting: Internal Medicine

## 2015-09-09 ENCOUNTER — Ambulatory Visit
Admission: RE | Admit: 2015-09-09 | Discharge: 2015-09-09 | Disposition: A | Discharge status: Home / Self Care | Payer: Medicare Other | Source: Ambulatory Visit | Attending: Internal Medicine | Admitting: Internal Medicine

## 2015-09-09 DIAGNOSIS — Z1231 Encounter for screening mammogram for malignant neoplasm of breast: Secondary | ICD-10-CM | POA: Insufficient documentation

## 2016-08-23 ENCOUNTER — Other Ambulatory Visit: Payer: Self-pay | Admitting: Internal Medicine

## 2016-08-23 DIAGNOSIS — Z1231 Encounter for screening mammogram for malignant neoplasm of breast: Secondary | ICD-10-CM

## 2016-08-24 IMAGING — XA IR KYPHO VERTEBRAL ADDL AGMENTATION
1 series · 13 of 14 positions shown · non-contrast
Comparison: none

CLINICAL DATA: Severe low back pain secondary to compression
fractures at L2 and L4.

[Series 300: ir kypho vertebral lumbar augmentation · 13 of 14 slices shown]
[im 1/14]
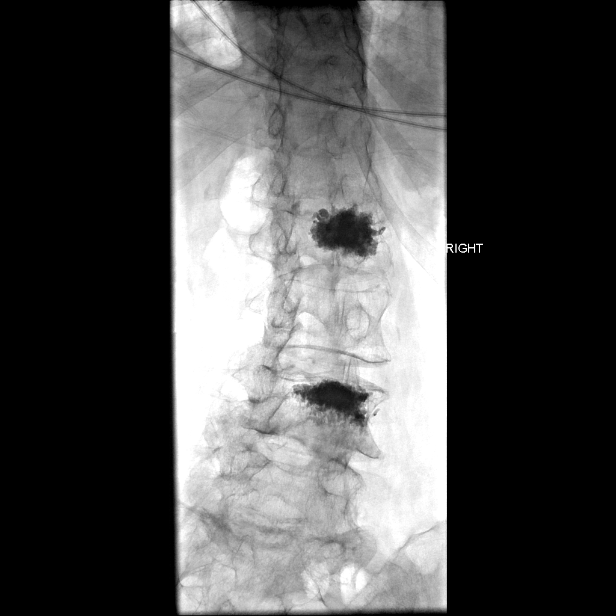
[im 2/14]
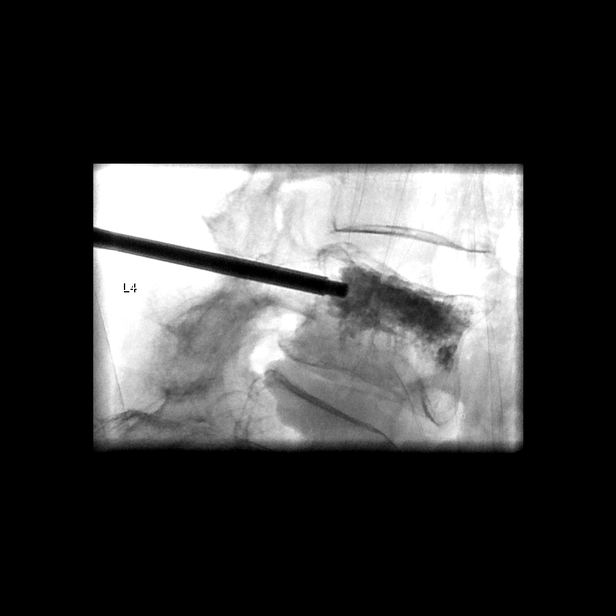
[im 3/14]
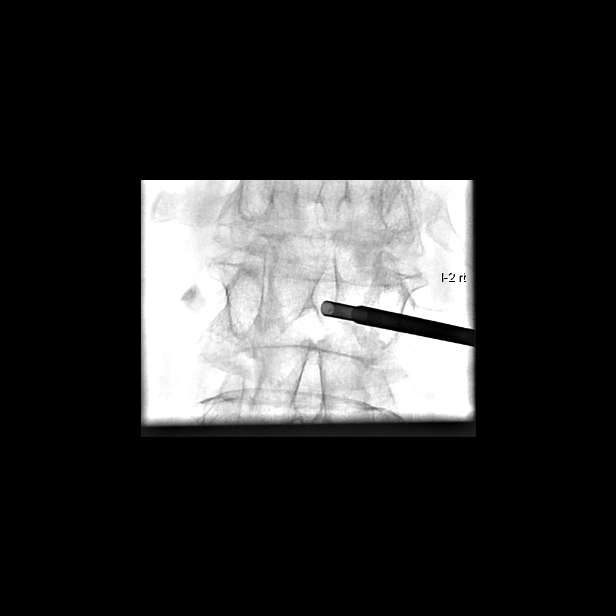
[im 4/14]
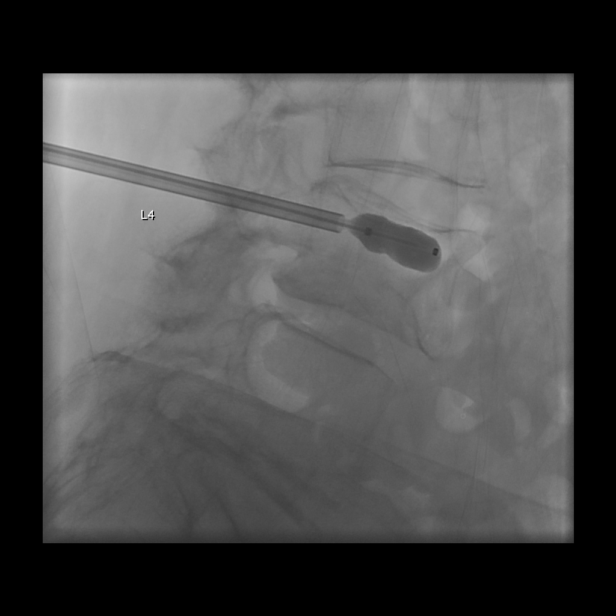
[im 5/14]
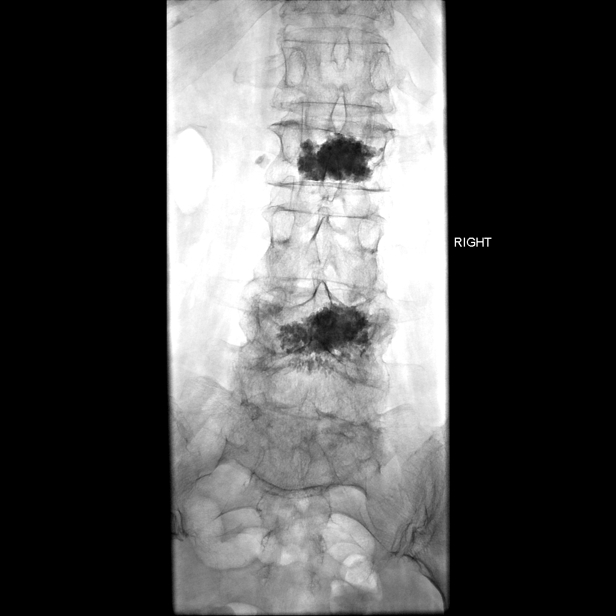
[im 6/14]
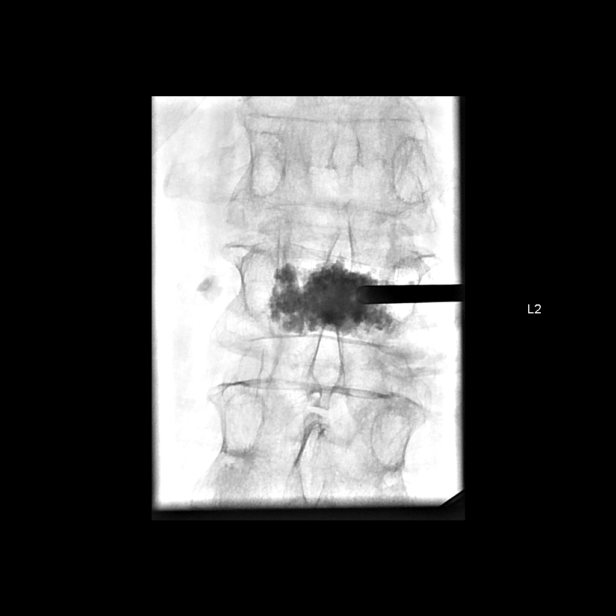
[im 8/14]
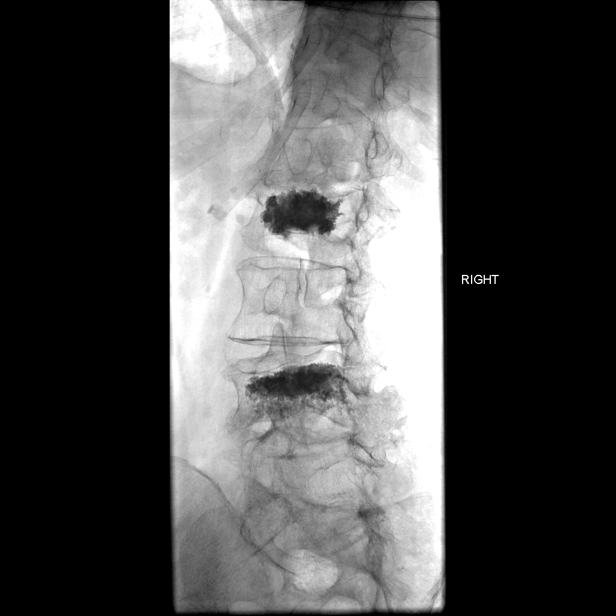
[im 9/14]
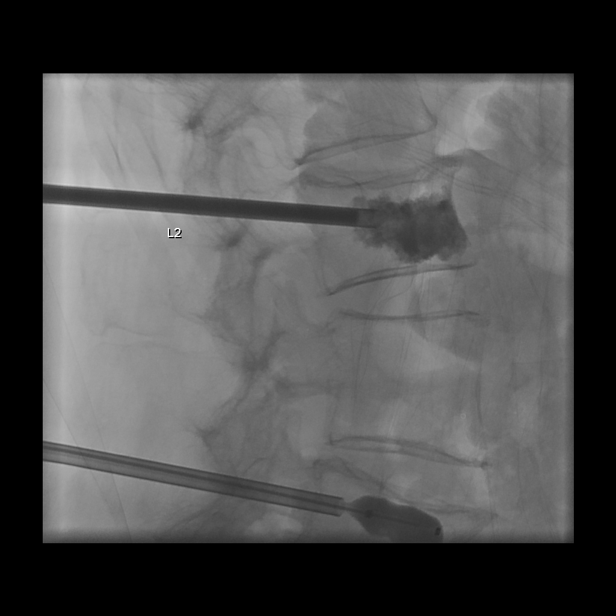
[im 10/14]
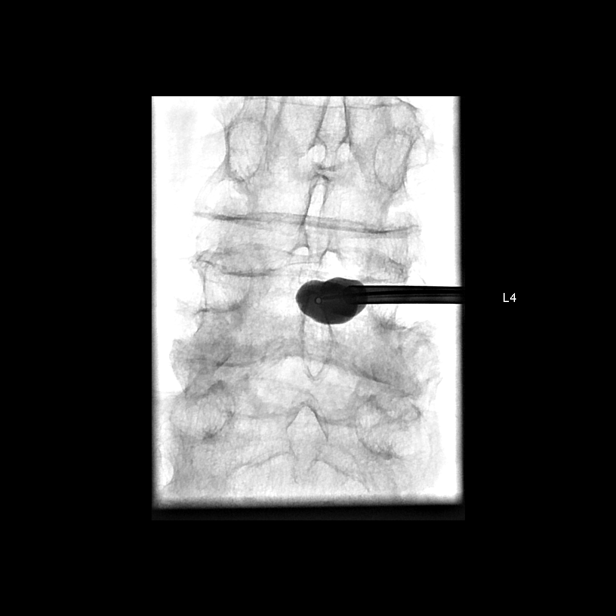
[im 11/14]
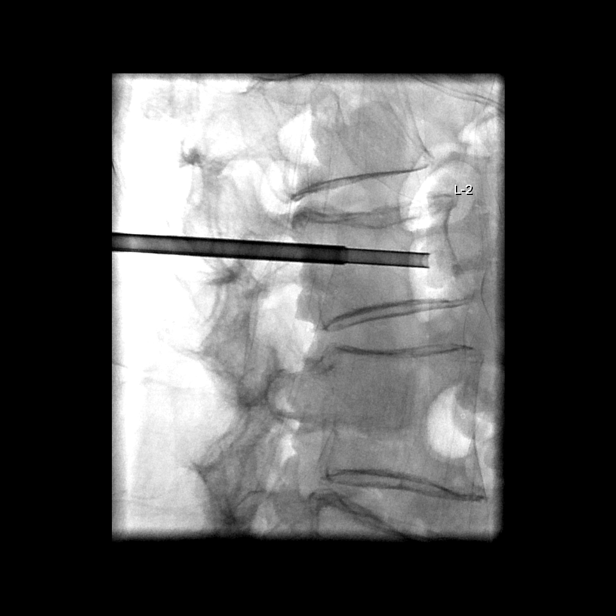
[im 12/14]
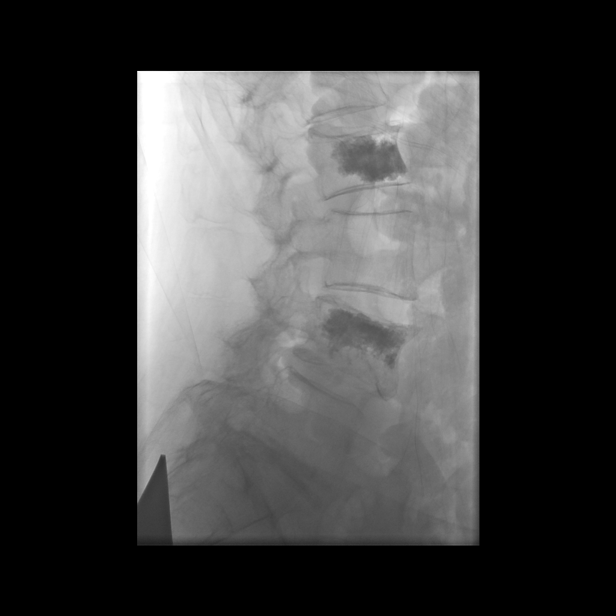
[im 13/14]
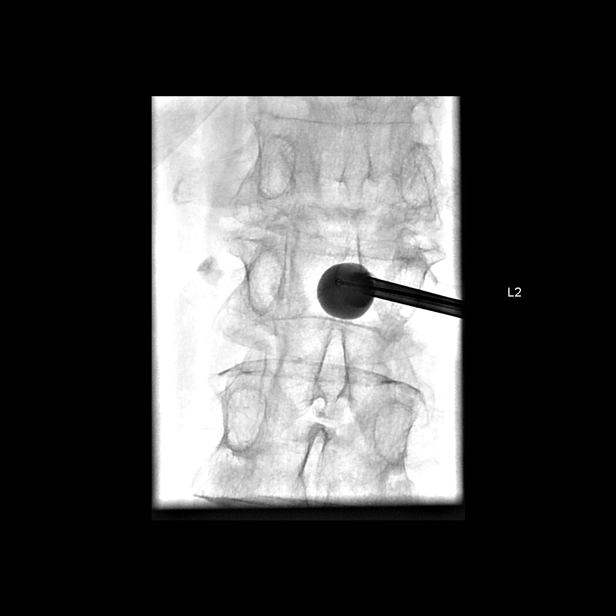
[im 14/14]
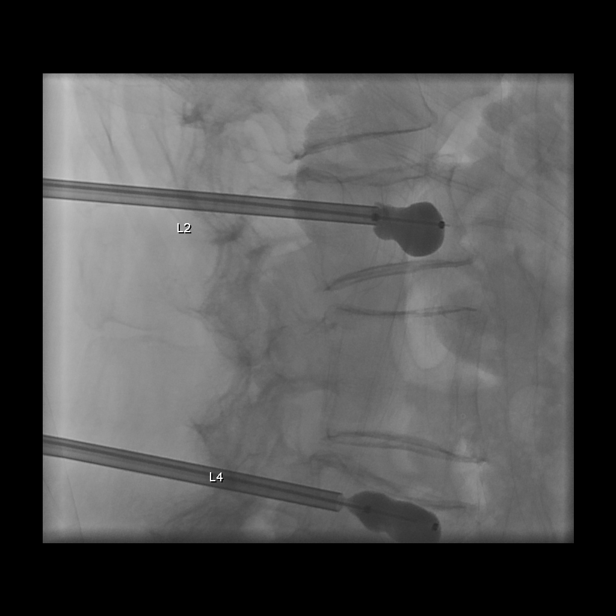

[13 of 14 positions shown; findings below may reference images not displayed]

EXAM:
KYPHOPLASTY AT L2 AND AT L4:

PROCEDURE:
Following a full explanation of the procedure along with the
potentially associated complications, an informed witnessed consent
was obtained.

The patient was placed prone on the fluoroscopic table. The skin
overlying the lumbar region was then prepped and draped in the usual
sterile fashion. The right pedicle at L2 and at L4 were then
infiltrated with 0.25% bupivacaine followed by the advancement of an
11-gauge Jamshidi needle through the right pedicle at both the
levels into the posterior one-third at L2 and L4. These were then
exchanged for a Kyphon advanced osteo introducer system comprised of
a working cannula and a Kyphon osteo drill at these 2 levels.

These combinations were then advanced over a Kyphon osteo bone pin
until the tips of the Kyphon osteo drills were in the posterior
third at L2 and L4.

At this time, the bone pins were removed. In a medial trajectory,
the combinations were advanced until the tips of the working
cannulae were inside the posterior one-third at L2 and L4.

The osteo drills were removed and a core sample from each level was
sent for pathologic analysis.

Through each of the working cannulae, a Kyphon bone biopsy device
was advanced to within 5 mm of the anterior aspect of L2 and L4. A
core sample from each level was also sent for pathologic analysis.

Through the working cannulae at L1 and L2, a Kyphon inflatable bone
tamp 20 x 3 was advanced and positioned with the distal marker 5 mm
from the anterior aspect of L2 and at L4. Crossing of the midline
was seen on the AP projection at each of the levels. At this time,
the balloon was expanded using contrast via a Kyphon inflation
syringe device via microtubing.

Inflations were continued until there was apposition with the
superior and the inferior endplates at the 2 levels.

At this time, methylmethacrylate mixture was reconstituted
withTobramycin in the Kyphon bone mixing device system. This was
then loaded onto the Kyphon bone fillers.

The balloon was deflated and removed followed by the instillation of
4 bone filler equivalents of methylmethacrylate mixture at L2, and
four bone filler equivalents of methylmethacrylate mixture at L4
with excellent filling in the AP and lateral projections at the 2
levels . No extravasation was noted in the disk spaces or
posteriorly into the spinal canal. No epidural venous contamination
was seen.

The patient tolerated the procedure well. There were no acute
complications. The working cannula and the bone fillers were then
retrieved and removed.

Medications utilized: Versed 2 mg IV and Fentanyl 75 micrograms IV.
Dilaudid 1 mg IV.

Total Moderate Sedation Time:  48 minutes
IMPRESSION: 1. Status post fluoroscopic-guided needle placement for deep core
bone biopsy at L4 and L2.

2. Status post vertebral body augmentation using balloon kyphoplasty
at L2 and L4 as described without event.

## 2016-08-26 ENCOUNTER — Ambulatory Visit: Payer: Medicare Other

## 2016-09-10 ENCOUNTER — Ambulatory Visit
Admission: RE | Admit: 2016-09-10 | Discharge: 2016-09-10 | Disposition: A | Discharge status: Home / Self Care | Payer: Medicare Other | Source: Ambulatory Visit | Attending: Internal Medicine | Admitting: Internal Medicine

## 2016-09-10 DIAGNOSIS — Z1231 Encounter for screening mammogram for malignant neoplasm of breast: Secondary | ICD-10-CM | POA: Insufficient documentation

## 2017-01-20 ENCOUNTER — Encounter: Payer: Self-pay | Admitting: *Deleted

## 2017-01-21 ENCOUNTER — Ambulatory Visit: Payer: Medicare Other | Admitting: Anesthesiology

## 2017-01-21 ENCOUNTER — Encounter
Admission: RE | Disposition: A | Discharge status: Home / Self Care | Payer: Self-pay | Source: Ambulatory Visit | Attending: Unknown Physician Specialty

## 2017-01-21 ENCOUNTER — Encounter: Payer: Self-pay | Admitting: *Deleted

## 2017-01-21 ENCOUNTER — Ambulatory Visit
Admission: RE | Admit: 2017-01-21 | Discharge: 2017-01-21 | Disposition: A | Discharge status: Home / Self Care | Payer: Medicare Other | Source: Ambulatory Visit | Attending: Unknown Physician Specialty | Admitting: Unknown Physician Specialty

## 2017-01-21 DIAGNOSIS — I10 Essential (primary) hypertension: Secondary | ICD-10-CM | POA: Diagnosis not present

## 2017-01-21 DIAGNOSIS — I272 Pulmonary hypertension, unspecified: Secondary | ICD-10-CM | POA: Insufficient documentation

## 2017-01-21 DIAGNOSIS — Z9049 Acquired absence of other specified parts of digestive tract: Secondary | ICD-10-CM | POA: Insufficient documentation

## 2017-01-21 DIAGNOSIS — Z1211 Encounter for screening for malignant neoplasm of colon: Secondary | ICD-10-CM | POA: Insufficient documentation

## 2017-01-21 DIAGNOSIS — K640 First degree hemorrhoids: Secondary | ICD-10-CM | POA: Insufficient documentation

## 2017-01-21 DIAGNOSIS — Z803 Family history of malignant neoplasm of breast: Secondary | ICD-10-CM | POA: Insufficient documentation

## 2017-01-21 DIAGNOSIS — Z791 Long term (current) use of non-steroidal anti-inflammatories (NSAID): Secondary | ICD-10-CM | POA: Diagnosis not present

## 2017-01-21 DIAGNOSIS — Z888 Allergy status to other drugs, medicaments and biological substances status: Secondary | ICD-10-CM | POA: Diagnosis not present

## 2017-01-21 DIAGNOSIS — Z86010 Personal history of colonic polyps: Secondary | ICD-10-CM | POA: Insufficient documentation

## 2017-01-21 DIAGNOSIS — Z9889 Other specified postprocedural states: Secondary | ICD-10-CM | POA: Diagnosis not present

## 2017-01-21 DIAGNOSIS — Z90721 Acquired absence of ovaries, unilateral: Secondary | ICD-10-CM | POA: Insufficient documentation

## 2017-01-21 DIAGNOSIS — Z79899 Other long term (current) drug therapy: Secondary | ICD-10-CM | POA: Insufficient documentation

## 2017-01-21 DIAGNOSIS — Z89021 Acquired absence of right finger(s): Secondary | ICD-10-CM | POA: Diagnosis not present

## 2017-01-21 DIAGNOSIS — F329 Major depressive disorder, single episode, unspecified: Secondary | ICD-10-CM | POA: Diagnosis not present

## 2017-01-21 HISTORY — DX: Unspecified osteoarthritis, unspecified site: M19.90

## 2017-01-21 HISTORY — DX: Major depressive disorder, single episode, unspecified: F32.9

## 2017-01-21 HISTORY — DX: Headache, unspecified: R51.9

## 2017-01-21 HISTORY — PX: COLONOSCOPY WITH PROPOFOL: SHX5780

## 2017-01-21 HISTORY — DX: Headache: R51

## 2017-01-21 HISTORY — DX: Cervicalgia: M54.2

## 2017-01-21 HISTORY — DX: Pulmonary hypertension, unspecified: I27.20

## 2017-01-21 HISTORY — DX: Anemia, unspecified: D64.9

## 2017-01-21 HISTORY — DX: Depression, unspecified: F32.A

## 2017-01-21 HISTORY — DX: Essential (primary) hypertension: I10

## 2017-01-21 SURGERY — COLONOSCOPY WITH PROPOFOL
Anesthesia: General

## 2017-01-21 MED ORDER — LIDOCAINE HCL (PF) 2 % IJ SOLN
INTRAMUSCULAR | Status: AC
  Filled 2017-01-21: qty 2

## 2017-01-21 MED ORDER — FENTANYL CITRATE (PF) 100 MCG/2ML IJ SOLN
INTRAMUSCULAR | Status: DC | PRN
  Administered 2017-01-21: 100 ug via INTRAVENOUS

## 2017-01-21 MED ORDER — FENTANYL CITRATE (PF) 100 MCG/2ML IJ SOLN
INTRAMUSCULAR | Status: AC
  Filled 2017-01-21: qty 2

## 2017-01-21 MED ORDER — PROPOFOL 500 MG/50ML IV EMUL
INTRAVENOUS | Status: DC | PRN
  Administered 2017-01-21: 120 ug/kg/min via INTRAVENOUS

## 2017-01-21 MED ORDER — SODIUM CHLORIDE 0.9 % IV SOLN
INTRAVENOUS | Status: DC
  Administered 2017-01-21: 1000 mL via INTRAVENOUS

## 2017-01-21 MED ORDER — PROPOFOL 10 MG/ML IV BOLUS
INTRAVENOUS | Status: DC | PRN
  Administered 2017-01-21: 30 mg via INTRAVENOUS
  Administered 2017-01-21: 20 mg via INTRAVENOUS

## 2017-01-21 MED ORDER — PROPOFOL 10 MG/ML IV BOLUS
INTRAVENOUS | Status: AC
  Filled 2017-01-21: qty 20

## 2017-01-21 MED ORDER — ATROPINE SULFATE 0.4 MG/ML IJ SOLN
INTRAMUSCULAR | Status: AC
  Filled 2017-01-21: qty 1

## 2017-01-21 MED ORDER — ATROPINE SULFATE 0.4 MG/ML IJ SOLN
INTRAMUSCULAR | Status: DC | PRN
  Administered 2017-01-21: 0.4 mg via INTRAVENOUS

## 2017-01-21 MED ORDER — SODIUM CHLORIDE 0.9 % IV SOLN: INTRAVENOUS | Status: DC

## 2017-01-21 NOTE — H&P (Signed)
Primary Care Physician:  Lynnea FerrierBERT J KLEIN III, MD Primary Gastroenterologist:  Dr. Mechele CollinElliott  Pre-Procedure History & Physical: HPI:  Susan LedgerMarie G Mann is a 71 y.o. female is here for an colonoscopy.   Past Medical History:  Diagnosis Date  . Anemia   . Arthritis   . Cervicalgia   . Depression   . Headache   . Hypertension   . Pulmonary hypertension     Past Surgical History:  Procedure Laterality Date  . AMPUTATION FINGER Right    right index finger  . APPENDECTOMY    . CARPAL TUNNEL RELEASE Left   . OOPHORECTOMY    . SPINAL CORD STIMULATOR IMPLANT      Prior to Admission medications   Medication Sig Start Date End Date Taking? Authorizing Provider  acetaminophen (TYLENOL) 500 MG tablet Take 1,000 mg by mouth every 8 (eight) hours as needed for headache.   Yes Historical Provider, MD  amLODipine (NORVASC) 5 MG tablet Take 5 mg by mouth every morning.   Yes Historical Provider, MD  Calcium-Magnesium-Vitamin D (CALCIUM 1200+D3 PO) Take 2 tablets by mouth every morning.   Yes Historical Provider, MD  citalopram (CELEXA) 40 MG tablet Take 40 mg by mouth every morning.   Yes Historical Provider, MD  clonazePAM (KLONOPIN) 1 MG tablet Take 1 mg by mouth at bedtime as needed for anxiety.   Yes Historical Provider, MD  gabapentin (NEURONTIN) 300 MG capsule Take 900 mg by mouth 3 (three) times daily.   Yes Historical Provider, MD  HYDROcodone-acetaminophen (NORCO/VICODIN) 5-325 MG per tablet Take 1 tablet by mouth every 6 (six) hours as needed for moderate pain.   Yes Historical Provider, MD  naproxen sodium (ALEVE) 220 MG tablet Take 220 mg by mouth 2 (two) times daily with a meal.   Yes Historical Provider, MD  PSYLLIUM PO Take 5 mLs by mouth daily. Mix with water   Yes Historical Provider, MD    Allergies as of 12/30/2016 - Review Complete 07/17/2015  Allergen Reaction Noted  . Tegretol [carbamazepine] Itching and Rash 07/11/2015    Family History  Problem Relation Age of Onset  .  Breast cancer Mother [id number]    Social History   Social History  . Marital status: Married    Spouse name: N/A  . Number of children: N/A  . Years of education: N/A   Occupational History  . Not on file.   Social History Main Topics  . Smoking status: Never Smoker  . Smokeless tobacco: Never Used  . Alcohol use No  . Drug use: No  . Sexual activity: Not on file   Other Topics Concern  . Not on file   Social History Narrative  . No narrative on file    Review of Systems: See HPI, otherwise negative ROS  Physical Exam: BP (!) 167/84   Pulse 66   Temp 97.1 F (36.2 C) (Tympanic)   Resp 18   Ht 5\' 3"  (1.6 m)   Wt 78.9 kg (174 lb)   SpO2 100%   BMI 30.82 kg/m  General:   Alert,  pleasant and cooperative in NAD Head:  Normocephalic and atraumatic. Neck:  Supple; no masses or thyromegaly. Lungs:  Clear throughout to auscultation.    Heart:  Regular rate and rhythm. Abdomen:  Soft, nontender and nondistended. Normal bowel sounds, without guarding, and without rebound.   Neurologic:  Alert and  oriented x4;  grossly normal neurologically.  Impression/Plan: Susan Mann is here for an  colonoscopy to be performed for [facility] colon polyps  Risks, benefits, limitations, and alternatives regarding  colonoscopy have been reviewed with the patient.  Questions have been answered.  All parties agreeable.   Lynnae Prude, MD  01/21/2017, 9:35 AM

## 2017-01-21 NOTE — Anesthesia Preprocedure Evaluation (Signed)
Anesthesia Evaluation  Patient identified by MRN, date of birth, ID band Patient awake    Reviewed: Allergy & Precautions, NPO status , Patient's Chart, lab work & pertinent test results  Airway Mallampati: III       Dental  (+) Upper Dentures   Pulmonary neg pulmonary ROS,    breath sounds clear to auscultation       Cardiovascular Exercise Tolerance: Good hypertension, Pt. on medications  Rhythm:Regular Rate:Normal     Neuro/Psych Depression negative neurological ROS     GI/Hepatic negative GI ROS, Neg liver ROS,   Endo/Other  negative endocrine ROS  Renal/GU negative Renal ROS     Musculoskeletal  (+) Arthritis ,   Abdominal Normal abdominal exam  (+)   Peds  Hematology  (+) anemia ,   Anesthesia Other Findings   Reproductive/Obstetrics                             Anesthesia Physical Anesthesia Plan  ASA: II  Anesthesia Plan: General   Post-op Pain Management:    Induction: Intravenous  Airway Management Planned: Natural Airway and Nasal Cannula  Additional Equipment:   Intra-op Plan:   Post-operative Plan:   Informed Consent: I have reviewed the patients History and Physical, chart, labs and discussed the procedure including the risks, benefits and alternatives for the proposed anesthesia with the patient or authorized representative who has indicated his/her understanding and acceptance.     Plan Discussed with: Surgeon  Anesthesia Plan Comments:         Anesthesia Quick Evaluation

## 2017-01-21 NOTE — Anesthesia Postprocedure Evaluation (Signed)
Anesthesia Post Note  Patient: Susan LedgerMarie G Urbanik  Procedure(s) Performed: Procedure(s) (LRB): COLONOSCOPY WITH PROPOFOL (N/A)  Patient location during evaluation: PACU Anesthesia Type: General Level of consciousness: awake Pain management: pain level controlled Vital Signs Assessment: post-procedure vital signs reviewed and stable Respiratory status: nonlabored ventilation Cardiovascular status: stable Anesthetic complications: no     Last Vitals:  Vitals:   01/21/17 1040 01/21/17 1100  BP: (!) 154/66   Pulse: 62 62  Resp: 19 20  Temp:      Last Pain:  Vitals:   01/21/17 1100  TempSrc:   PainSc: 0-No pain                 VAN STAVEREN,Darbie Biancardi

## 2017-01-21 NOTE — Transfer of Care (Signed)
Immediate Anesthesia Transfer of Care Note  Patient: Susan Mann  Procedure(s) Performed: Procedure(s): COLONOSCOPY WITH PROPOFOL (N/A)  Patient Location: PACU  Anesthesia Type:General  Level of Consciousness: awake  Airway & Oxygen Therapy: Patient Spontanous Breathing  Post-op Assessment: Report given to RN and Post -op Vital signs reviewed and stable  Post vital signs: Reviewed  Last Vitals:  Vitals:   01/21/17 0909  BP: (!) 167/84  Pulse: 66  Resp: 18  Temp: 36.2 C    Last Pain:  Vitals:   01/21/17 0909  TempSrc: Tympanic         Complications: No apparent anesthesia complications

## 2017-01-21 NOTE — Anesthesia Post-op Follow-up Note (Signed)
Anesthesia QCDR form completed.        

## 2017-01-21 NOTE — Op Note (Signed)
[facility] Gastroenterology Patient Name: Susan Mann Procedure Date: 01/21/2017 9:38 AM MRN: [id number] Account #: [id number] Date of Birth: 12/03/1946 Admit Type: Outpatient Age: 71 Room: [facility] ENDO ROOM 1 Gender: Female Note Status: Finalized Procedure:            Colonoscopy Indications:          High risk colon cancer surveillance: Personal history                        of colonic polyps Providers:            Scot Jun, MD Referring MD:         Daniel Nones, MD (Referring MD) Medicines:            Propofol per Anesthesia Complications:        No immediate complications. Procedure:            Pre-Anesthesia Assessment:                       - After reviewing the risks and benefits, the patient                        was deemed in satisfactory condition to undergo the                        procedure.                       After obtaining informed consent, the colonoscope was                        passed under direct vision. Throughout the procedure,                        the patient's blood pressure, pulse, and oxygen                        saturations were monitored continuously. The                        Colonoscope was introduced through the anus and                        advanced to the the cecum, identified by appendiceal                        orifice and ileocecal valve. The colonoscopy was                        somewhat difficult due to significant looping and a                        tortuous colon. Successful completion of the procedure                        was aided by applying abdominal pressure. The patient                        tolerated the procedure well. The quality of the bowel  preparation was excellent. Findings:      Internal hemorrhoids were found during endoscopy. The hemorrhoids were       small and Grade I (internal hemorrhoids that do not prolapse).      The exam was otherwise without  abnormality. Impression:           - Internal hemorrhoids.                       - The examination was otherwise normal.                       - No specimens collected. Recommendation:       - Repeat colonoscopy in 5 years for screening purposes. Procedure Code(s):    --- Professional ---                       [phone], Colonoscopy, flexible; diagnostic, including                        collection of specimen(s) by brushing or washing, when                        performed (separate procedure) Diagnosis Code(s):    --- Professional ---                       Z86.010, Personal history of colonic polyps                       K64.0, First degree hemorrhoids CPT copyright 2016 [employer]. All rights reserved. The codes documented in this report are preliminary and upon coder review may  be revised to meet current compliance requirements. Scot Junobert T Bijal Siglin, MD 01/21/2017 10:09:45 AM This report has been signed electronically. Number of Addenda: 0 Note Initiated On: 01/21/2017 9:38 AM Scope Withdrawal Time: 0 hours 10 minutes 22 seconds  Total Procedure Duration: 0 hours 21 minutes 28 seconds       [facility]

## 2017-01-24 ENCOUNTER — Encounter: Payer: Self-pay | Admitting: Unknown Physician Specialty

## 2017-06-05 DEATH — deceased

## 2017-07-06 DEATH — deceased
